# Patient Record
Sex: Female | Born: 1950 | Race: White | Hispanic: No | Marital: Married | Smoking: Current every day smoker
Health system: Southern US, Community
[De-identification: ages and names within clinical notes are randomized; demographics above are authoritative.]

## PROBLEM LIST (undated history)

## (undated) DIAGNOSIS — C801 Malignant (primary) neoplasm, unspecified: Secondary | ICD-10-CM

## (undated) DIAGNOSIS — Z974 Presence of external hearing-aid: Secondary | ICD-10-CM

## (undated) DIAGNOSIS — K219 Gastro-esophageal reflux disease without esophagitis: Secondary | ICD-10-CM

## (undated) HISTORY — PX: COLONOSCOPY: SHX174

## (undated) HISTORY — PX: ABDOMINAL HYSTERECTOMY: SHX81

---

## 2005-01-01 ENCOUNTER — Ambulatory Visit: Payer: Self-pay

## 2006-01-06 ENCOUNTER — Ambulatory Visit: Payer: Self-pay

## 2006-09-24 ENCOUNTER — Ambulatory Visit: Payer: Self-pay | Admitting: Unknown Physician Specialty

## 2007-04-01 ENCOUNTER — Ambulatory Visit: Payer: Self-pay | Admitting: Unknown Physician Specialty

## 2007-04-13 ENCOUNTER — Ambulatory Visit: Payer: Self-pay

## 2007-06-02 HISTORY — PX: BREAST CYST ASPIRATION: SHX578

## 2008-05-01 ENCOUNTER — Ambulatory Visit: Payer: Self-pay

## 2008-05-14 ENCOUNTER — Ambulatory Visit: Payer: Self-pay

## 2008-05-23 ENCOUNTER — Ambulatory Visit: Payer: Self-pay | Admitting: Family Medicine

## 2009-04-11 ENCOUNTER — Ambulatory Visit: Payer: Self-pay | Admitting: Unknown Physician Specialty

## 2009-05-21 ENCOUNTER — Ambulatory Visit: Payer: Self-pay

## 2010-03-01 ENCOUNTER — Ambulatory Visit: Payer: Self-pay | Admitting: Gynecologic Oncology

## 2010-03-03 ENCOUNTER — Ambulatory Visit: Payer: Self-pay | Admitting: Unknown Physician Specialty

## 2010-03-07 ENCOUNTER — Ambulatory Visit: Payer: Self-pay | Admitting: Unknown Physician Specialty

## 2010-03-12 LAB — PATHOLOGY REPORT

## 2010-03-18 ENCOUNTER — Ambulatory Visit: Payer: Self-pay | Admitting: Gynecologic Oncology

## 2010-04-01 ENCOUNTER — Ambulatory Visit: Payer: Self-pay | Admitting: Gynecologic Oncology

## 2010-04-08 ENCOUNTER — Ambulatory Visit: Payer: Self-pay | Admitting: Gynecologic Oncology

## 2010-05-01 ENCOUNTER — Ambulatory Visit: Payer: Self-pay | Admitting: Gynecologic Oncology

## 2010-05-22 ENCOUNTER — Ambulatory Visit: Payer: Self-pay | Admitting: Unknown Physician Specialty

## 2010-07-12 ENCOUNTER — Ambulatory Visit: Payer: Self-pay | Admitting: Internal Medicine

## 2011-07-01 ENCOUNTER — Ambulatory Visit: Payer: Self-pay

## 2011-07-31 ENCOUNTER — Ambulatory Visit: Payer: Self-pay | Admitting: Unknown Physician Specialty

## 2011-11-27 IMAGING — MG MAM DGTL DIAGNOSTIC MAMMO W/CAD
1 series · 6 of 6 positions shown · non-contrast
Comparison: none

REASON FOR EXAM: R BR NODULAR DEN FU   YEARLY
COMMENTS:

[R CC · right · 6 of 6 slices shown]
[im 1/6]
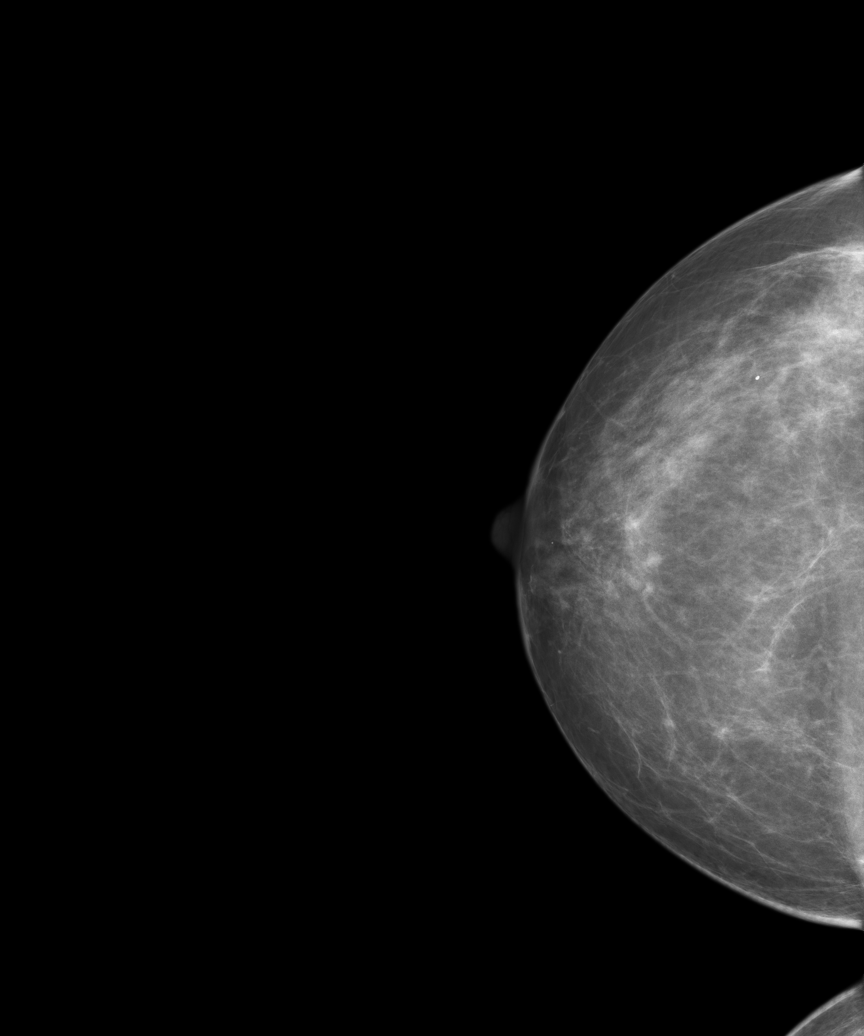
[im 2/6]
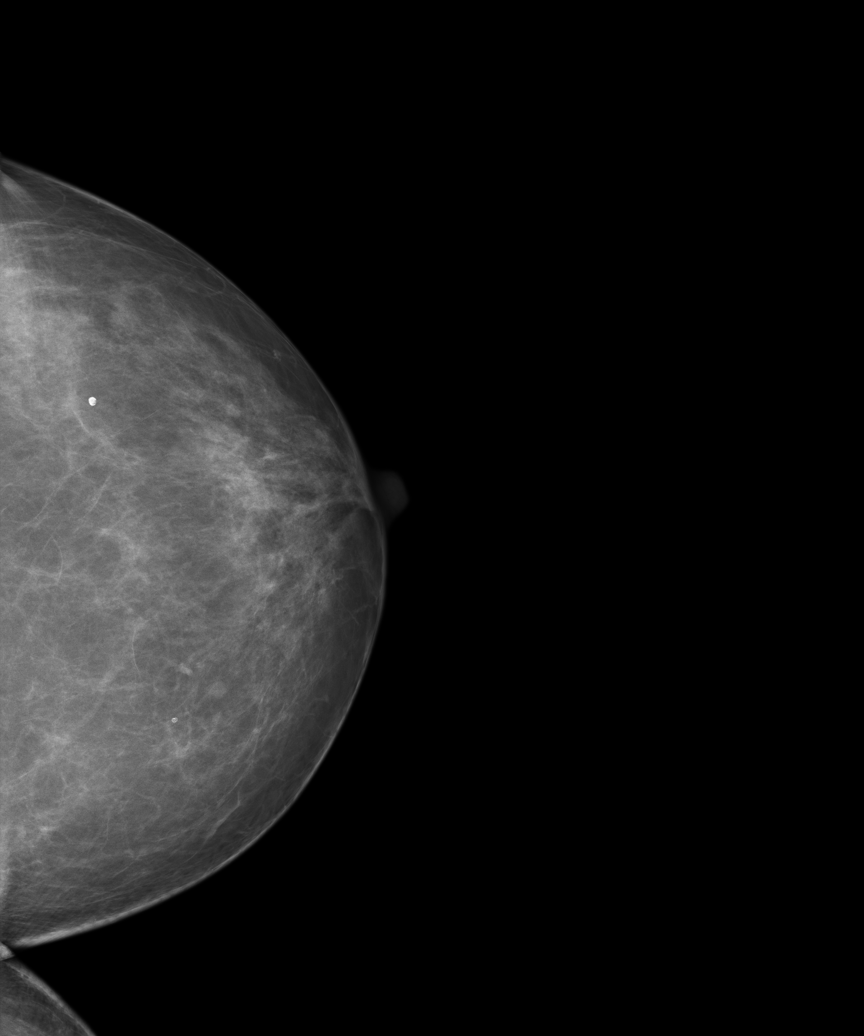
[im 3/6]
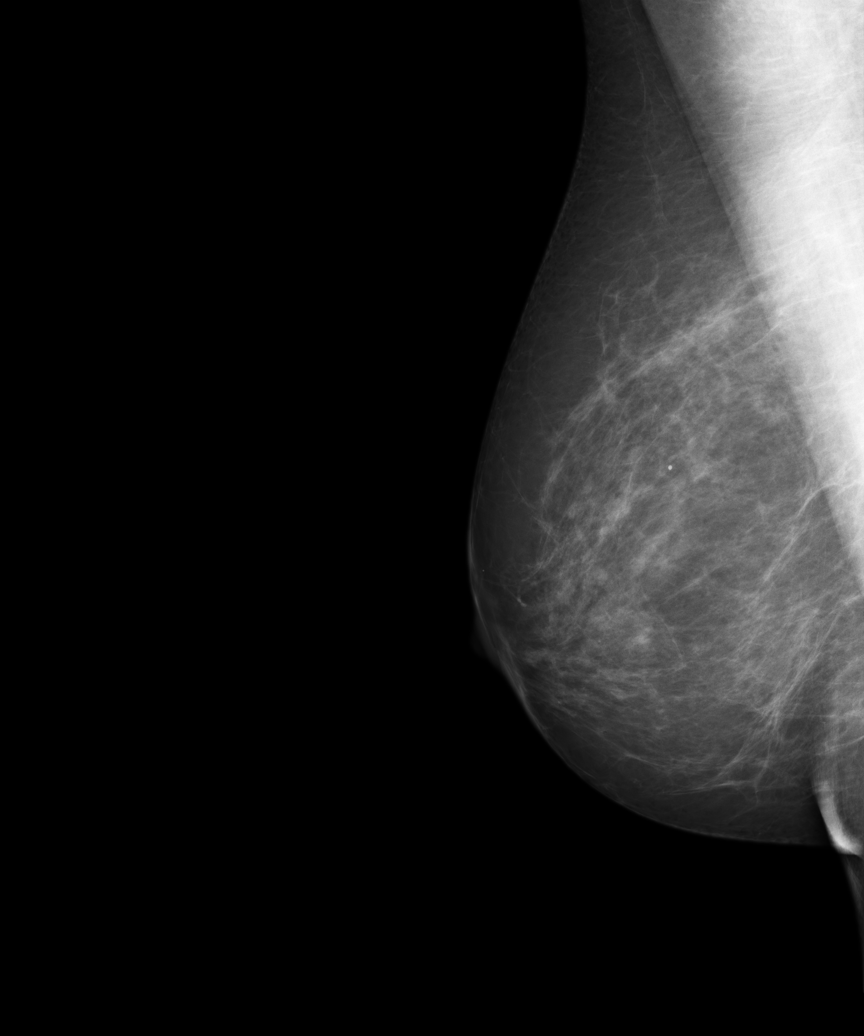
[im 4/6]
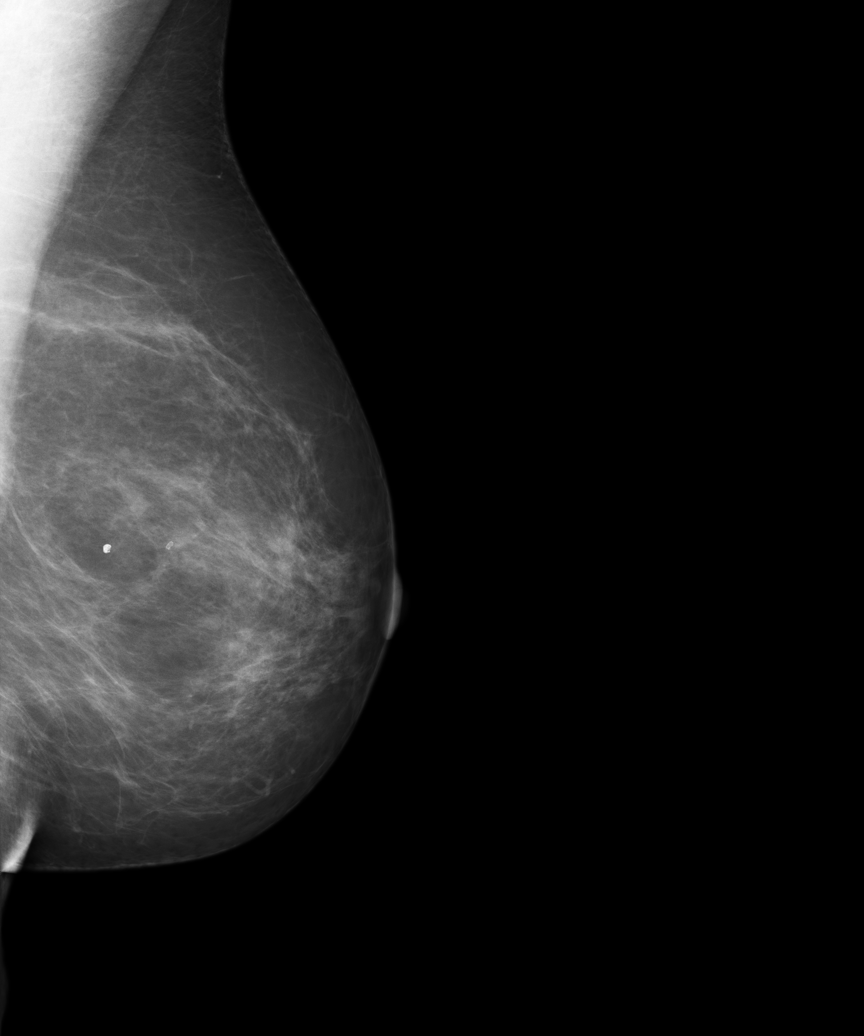
[im 5/6]
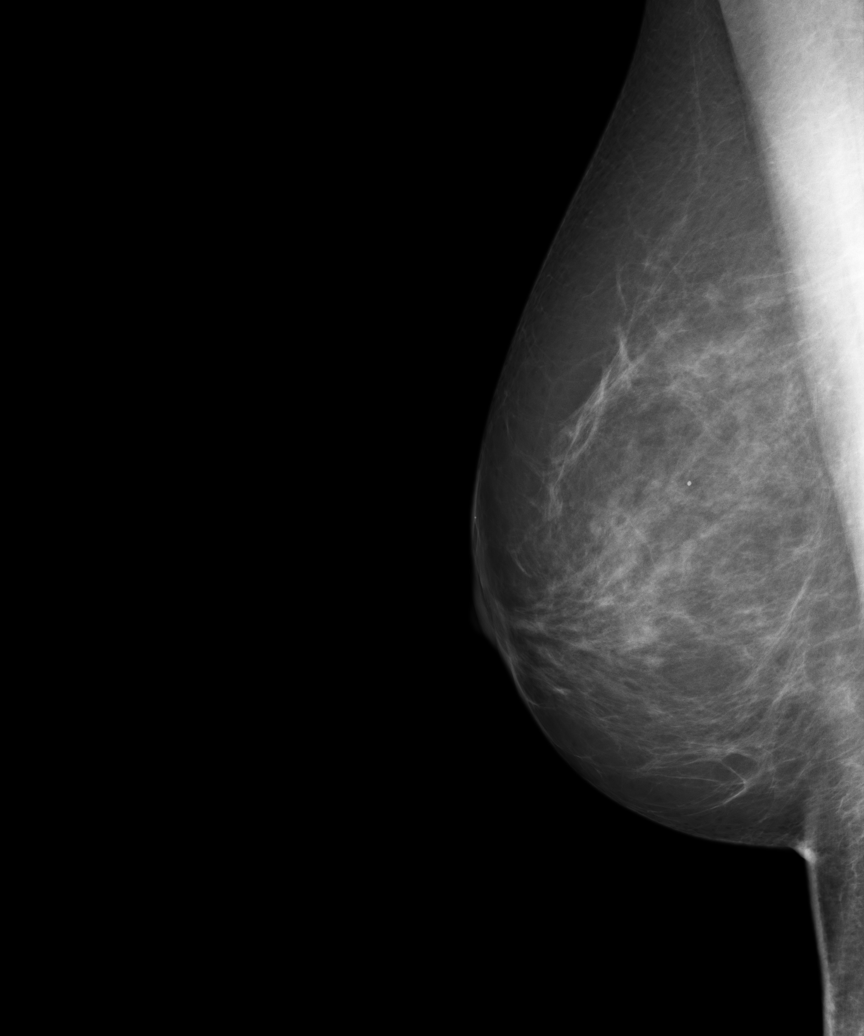
[im 6/6]
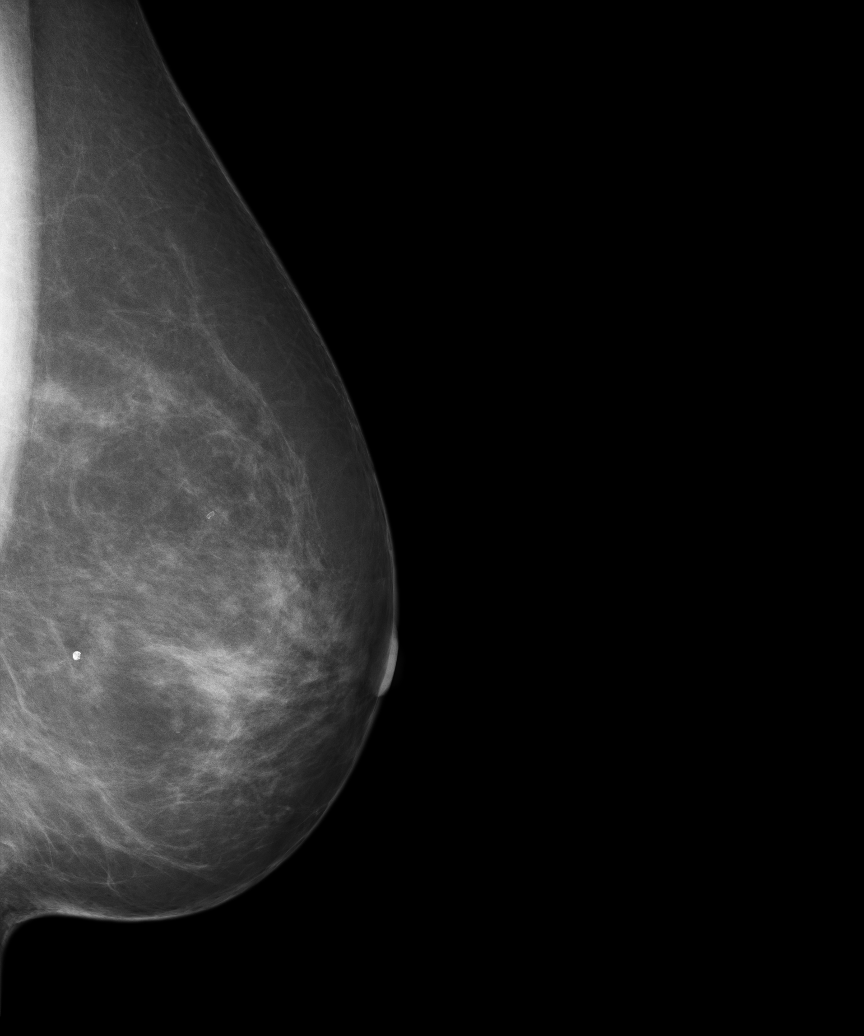

[6 of 6 positions shown; findings below may reference images not displayed]

PROCEDURE:     MAM - MAM DGTL DIAGNOSTIC MAMMO W/CAD  - May 21, 2009  [DATE]

RESULT:       Comparison is made to film screen images of 07/15/99 and to
digital images dated 05/01/08 as well as 05/18/01 from the right breast.

The breasts exhibit a mildly dense slightly heterogenous nodular
fibroglandular pattern.  A smoothly marginated density medially in the right
breast is no longer evident. There is no developing density or dominant
mass. There is no malignant calcification.
IMPRESSION: 1.      Benign-appearing bilateral mammogram.
2.     BI-RADS:  Category 2- Benign Finding.
3.     Please continue to encourage appropriate mammographic follow up.

A negative mammogram report does not preclude biopsy or other evaluation of
a clinically palpable or otherwise suspicious mass or lesion. Breast cancer
may not be detected by mammography in up to 10% of cases.

## 2012-01-27 ENCOUNTER — Ambulatory Visit: Payer: Self-pay | Admitting: Internal Medicine

## 2012-01-27 LAB — URINALYSIS, COMPLETE
Bilirubin,UR: NEGATIVE
Glucose,UR: NEGATIVE mg/dL (ref 0–75)
Nitrite: NEGATIVE
Ph: 6 (ref 4.5–8.0)
Protein: NEGATIVE

## 2012-07-12 ENCOUNTER — Ambulatory Visit: Payer: Self-pay

## 2013-03-16 ENCOUNTER — Emergency Department: Payer: Self-pay | Admitting: Emergency Medicine

## 2013-03-16 ENCOUNTER — Ambulatory Visit: Payer: Self-pay

## 2013-03-16 LAB — BASIC METABOLIC PANEL
Anion Gap: 6 — ABNORMAL LOW (ref 7–16)
BUN: 18 mg/dL (ref 7–18)
Calcium, Total: 9.2 mg/dL (ref 8.5–10.1)
Chloride: 106 mmol/L (ref 98–107)
Co2: 28 mmol/L (ref 21–32)
EGFR (African American): >60
Sodium: 140 mmol/L (ref 136–145)

## 2013-03-16 LAB — CBC
HCT: 41.5 % (ref 35.0–47.0)
MCH: 30.9 pg (ref 26.0–34.0)
MCHC: 34.5 g/dL (ref 32.0–36.0)
MCV: 90 fL (ref 80–100)
Platelet: 303 10*3/uL (ref 150–440)
RBC: 4.63 10*6/uL (ref 3.80–5.20)
RDW: 13.2 % (ref 11.5–14.5)
WBC: 8.8 10*3/uL (ref 3.6–11.0)

## 2013-03-24 ENCOUNTER — Ambulatory Visit: Payer: Self-pay | Admitting: General Practice

## 2013-05-16 ENCOUNTER — Encounter: Payer: Self-pay | Admitting: General Practice

## 2013-06-01 ENCOUNTER — Encounter: Payer: Self-pay | Admitting: General Practice

## 2013-07-02 ENCOUNTER — Encounter: Payer: Self-pay | Admitting: General Practice

## 2013-07-20 ENCOUNTER — Ambulatory Visit: Payer: Self-pay | Admitting: Obstetrics and Gynecology

## 2013-07-30 ENCOUNTER — Encounter: Payer: Self-pay | Admitting: General Practice

## 2014-05-08 ENCOUNTER — Ambulatory Visit: Payer: Self-pay | Admitting: Family Medicine

## 2014-05-08 LAB — URINALYSIS, COMPLETE
Bilirubin,UR: NEGATIVE
GLUCOSE, UR: NEGATIVE
Ketone: NEGATIVE
NITRITE: NEGATIVE
Ph: 6 (ref 5.0–8.0)
Protein: NEGATIVE
Specific Gravity: 1.01 (ref 1.000–1.030)

## 2014-05-10 LAB — URINE CULTURE

## 2014-07-23 ENCOUNTER — Ambulatory Visit: Payer: Self-pay | Admitting: Family Medicine

## 2014-07-25 ENCOUNTER — Ambulatory Visit: Payer: Self-pay | Admitting: Family Medicine

## 2014-09-21 NOTE — Consult Note (Signed)
PATIENT NAME:  Debra Frank, Debra Frank MR#:  [id number] DATE OF BIRTH:  September 06, 1950  DATE OF CONSULTATION:  03/16/2013  CONSULTING PHYSICIAN:  Jeneen Rinks P. Holley Bouche., MD  REFERRING PHYSICIAN: Dr. Thomasene Lot (Emergency Department)   CHIEF COMPLAINT: Right wrist pain.   HISTORY OF PRESENT ILLNESS: The patient is a 64 year old right-hand dominant female who was playing with her grandson when she apparently fell from a skateboard and landed on her outstretched right hand. She had the immediate onset of severe right wrist pain and had noticeable deformity to the wrist. She apparently initially presented to [facility] and was subsequently referred to [facility] Emergency Department at which time radiographs were obtained. The patient denied any other injury or pain other than the right wrist.   PAST MEDICAL HISTORY: Uterine cancer. Status post hysterectomy.   MEDICATIONS: None.   ALLERGIES: No known drug allergies.   SOCIAL HISTORY: The patient is married. She works as a Photographer. She smokes one cigarette a day.   FAMILY HISTORY: Noncontributory.   PHYSICAL EXAMINATION: GENERAL: The patient is a pleasant, well-developed, well-nourished female seen in obvious discomfort.  RIGHT UPPER EXTREMITY:  No tenderness to palpation about the elbow. There is gross deformity to the distal radius with crepitus appreciated. Moderate swelling is noted without any gross ecchymosis. No erythema. Good capillary refill was noted. Good radial pulse is noted. Sensory motor function is grossly intact.   X-RAYS:  Attempted AP, lateral and oblique radiographs of the right wrist were reviewed. There is a comminuted right distal radius fracture with significant displacement.   IMPRESSION: Comminuted right distal radius fracture.   PLAN: Findings were discussed in detail with the patient and her husband. Recommended provisional closed reduction tonight with anticipated need for open  reduction and internal fixation of the right distal radius once the swelling subsides. The patient and her husband were in agreement. The patient's right hand was suspended finger traps and 5 pounds of counter traction was applied to the upper arm. This allowed for gentle reduction of the fracture site with improved alignment noted. A well-padded sugar tong splint was applied and traction was removed. Again, good capillary refill and sensory function is noted following the reduction.   The patient instructed on ice and elevation to the right wrist. Prescription was given for Norco 5/325, 1 to 2 tablets oral every 4 hours as needed for pain #50.   The patient is to call the office tomorrow for an appointment next week. Depending upon the swelling, I would anticipate of surgical intervention late next week.    ____________________________ Laurice Record. Holley Bouche., MD jph:cc D: 03/16/2013 22:33:36 ET T: 03/16/2013 22:47:22 ET JOB#: [id number]  cc: Laurice Record. Holley Bouche., MD, <Dictator> JAMES P Holley Bouche MD ELECTRONICALLY SIGNED 04/02/2013 11:07

## 2014-09-21 NOTE — Op Note (Signed)
PATIENT NAME:  Debra Frank, DURA MR#:  [id number] DATE OF BIRTH:  1950-08-27  DATE OF PROCEDURE:  03/24/2013  PREOPERATIVE DIAGNOSIS: Comminuted right distal radius fracture.   POSTOPERATIVE DIAGNOSIS:  Comminuted right distal radius fracture.   PROCEDURE PERFORMED: Open reduction and internal fixation of right distal radius fracture.   SURGEON: Skip Estimable, MD   ANESTHESIA: General.   ESTIMATED BLOOD LOSS: Minimal.   TOURNIQUET TIME: 74 minutes.   FLUIDS REPLACED: 800 mL of crystalloid.   IMPLANTS UTILIZED: [employer] DVRA right short plate, five 2.5 mm fully threaded pegs, two 2.5 mm smooth pegs, and three 3.5 mm cortical screws.   INDICATIONS FOR SURGERY: The patient is a 64 year old right-hand dominant female who fell on an outstretched right arm while playing with her a grandson on a skateboard. X-rays demonstrated a comminuted displaced right distal radius fracture. Provisional reduction has been performed in the Emergency Department, but definitive surgical intervention was deferred due to the degree of swelling. Recommendation was made for open reduction, internal fixation of the distal radius fracture. Risks and benefits of surgical intervention were discussed with the patient. The patient expressed understanding of the risks and benefits and agreed with plans for surgical intervention.   PROCEDURE IN DETAIL: The patient was brought into the Operating Room and, after adequate general anesthesia was achieved, a tourniquet was placed on the patient's upper right arm. The patient's right hand and arm were cleaned and prepped with alcohol and DuraPrep and draped in the usual sterile fashion. A "timeout" was performed as per usual protocol. Loupe magnification was used throughout the procedure.    The right upper extremity was exsanguinated using an Esmarch, and the tourniquet was inflated to 250 mmHg. A longitudinal incision was made in line with the flexor carpi radialis tendon. The  tendon sheath was incised, and the tendon was reflected in an ulnar direction. The floor of the tendon sheath was incised, and dissection was carried down to the pronator quadratus. An L-type incision was made, and the pronator was elevated off of the volar surface of the distal radius. The fracture site was identified and soft tissue was debrided from the fracture edges.   Using a combination of distraction, extension and subsequent flexion, the distal radius was reduced, and position was confirmed in AP and lateral planes using the FluoroScan. Provisional pin fixation of the DVRA short plate was performed, and position confirmed using the FluoroScan. A 3.5 mm cortical screw was inserted into the slotted hole and tightened into place. The distal fragment was then secured using two 2.0 mm K wires. Again, excellent reduction was noted in multiple planes using the FluoroScan.   A total of five 2.5 mm fully threaded pegs and two 2.5 mm smooth pegs were inserted into the proximal and distal rows of the volar plate. Good position was noted in multiple planes using the FluoroScan. Next, the proximal portion of the plate was further secured using two additional 3.5 mm screws. Good pronation and supination were noted. Good wrist flexion and extension were appreciated. Excellent reduction was appreciated. The wound was irrigated with copious amounts of normal saline with antibiotic solution.   The tourniquet was deflated after a total tourniquet time of 74 minutes. Hemostasis was achieved using electrocautery. The pronator was repaired using 2-0 Vicryl so as to cover the volar surface of the plate. The wound was then closed in layers using first 2-0 Vicryl with running subcuticular suture of 4-0 Vicryl used for the skin closure. Steri-Strips were  applied. 0.25% Marcaine was injected along the incision site. A sterile dressing was applied, followed by application of a volar splint.   The patient tolerated the  procedure well. She was transported to the recovery room in stable condition.   ____________________________ Laurice Record. Holley Bouche., MD jph:cg D: 03/24/2013 23:23:20 ET T: 03/25/2013 00:05:39 ET JOB#: [id number]  cc: Laurice Record. Holley Bouche., MD, <Dictator> Laurice Record Holley Bouche MD ELECTRONICALLY SIGNED 03/25/2013 11:42

## 2014-10-02 ENCOUNTER — Ambulatory Visit
Admission: EM | Admit: 2014-10-02 | Discharge: 2014-10-02 | Disposition: A | Discharge status: Home / Self Care | Payer: BC Managed Care – PPO | Attending: Family Medicine | Admitting: Family Medicine

## 2014-10-02 DIAGNOSIS — Z79899 Other long term (current) drug therapy: Secondary | ICD-10-CM | POA: Diagnosis not present

## 2014-10-02 DIAGNOSIS — J029 Acute pharyngitis, unspecified: Secondary | ICD-10-CM | POA: Diagnosis not present

## 2014-10-02 DIAGNOSIS — N390 Urinary tract infection, site not specified: Secondary | ICD-10-CM | POA: Diagnosis not present

## 2014-10-02 DIAGNOSIS — Z87891 Personal history of nicotine dependence: Secondary | ICD-10-CM | POA: Insufficient documentation

## 2014-10-02 HISTORY — DX: Malignant (primary) neoplasm, unspecified: C80.1

## 2014-10-02 LAB — RAPID STREP SCREEN (MED CTR MEBANE ONLY): STREPTOCOCCUS, GROUP A SCREEN (DIRECT): NEGATIVE

## 2014-10-02 LAB — URINALYSIS COMPLETE WITH MICROSCOPIC (ARMC ONLY)
Bilirubin Urine: NEGATIVE
Glucose, UA: NEGATIVE mg/dL
KETONES UR: NEGATIVE mg/dL
Nitrite: NEGATIVE
PH: 6.5 (ref 5.0–8.0)
Protein, ur: 30 mg/dL — AB
Specific Gravity, Urine: 1.015 (ref 1.005–1.030)

## 2014-10-02 MED ORDER — CIPROFLOXACIN HCL 500 MG PO TABS: 500.0000 mg | ORAL_TABLET | Freq: Two times a day (BID) | ORAL | Status: DC

## 2014-10-02 NOTE — Discharge Instructions (Signed)

## 2014-10-02 NOTE — ED Provider Notes (Signed)
CSN: [id number]     Arrival date & time 10/02/14  0759 History   First MD Initiated Contact with Patient 10/02/14 [phone]     Chief Complaint  Patient presents with  . Sore Throat  . Urinary Tract Infection   (Consider location/radiation/quality/duration/timing/severity/associated sxs/prior Treatment) Patient is a 64 y.o. female presenting with pharyngitis and urinary tract infection. The history is provided by the patient.  Sore Throat This is a new problem. The current episode started more than 1 week ago. Episode frequency: iintermittently. The problem has been gradually improving. The symptoms are aggravated by swallowing. She has tried acetaminophen for the symptoms.  Urinary Tract Infection This is a new problem. The current episode started 2 days ago. The problem occurs constantly. Associated symptoms comments: Dysuria, urinary frequency; cloudy urine. Treatments tried: otc pyridium.    Past Medical History  Diagnosis Date  . Cancer    Past Surgical History  Procedure Laterality Date  . Abdominal hysterectomy     Family History  Problem Relation Age of Onset  . Dementia Mother   . Cancer Father    History  Substance Use Topics  . Smoking status: Former Smoker    Quit date: 10/01/2009  . Smokeless tobacco: Not on file  . Alcohol Use: No   OB History    No data available     Review of Systems  Allergies  Review of patient's allergies indicates no known allergies.  Home Medications   Prior to Admission medications   Medication Sig Start Date End Date Taking? Authorizing Provider  ciprofloxacin (CIPRO) 500 MG tablet Take 1 tablet (500 mg total) by mouth every 12 (twelve) hours. 10/02/14   Norval Gable, MD   BP 132/77 mmHg  Pulse 92  Temp(Src) 97.7 F (36.5 C) (Tympanic)  Resp 17  Ht 5\' 6"  (1.676 m)  Wt 135 lb (61.236 kg)  BMI 21.80 kg/m2  SpO2 98% Physical Exam  Constitutional: She appears well-developed and well-nourished. No distress.  HENT:  Right Ear:  External ear normal.  Left Ear: External ear normal.  Nose: Nose normal.  Mouth/Throat: No oropharyngeal exudate.  Pharynx erythematous without exudate  Eyes: Conjunctivae are normal. Right eye exhibits no discharge. Left eye exhibits no discharge.  Neck: Normal range of motion. Neck supple. No thyromegaly present.  Cardiovascular: Normal heart sounds.   Pulmonary/Chest: Effort normal and breath sounds normal.  Abdominal: Soft. Bowel sounds are normal. She exhibits no distension and no mass. There is no tenderness. There is no rebound and no guarding.  Lymphadenopathy:    She has no cervical adenopathy.  Skin: Skin is warm and dry. No rash noted. She is not diaphoretic. No erythema.  Nursing note and vitals reviewed.   ED Course  Procedures (including critical care time) Labs Review Labs Reviewed  URINALYSIS COMPLETEWITH MICROSCOPIC [facility])  - Abnormal; Notable for the following:    APPearance HAZY (*)    Hgb urine dipstick 2+ (*)    Protein, ur 30 (*)    Leukocytes, UA 3+ (*)    Bacteria, UA FEW (*)    Squamous Epithelial / LPF 0-5 (*)    All other components within normal limits  RAPID STREP SCREEN  URINE CULTURE  CULTURE, GROUP A STREP [facility])    Imaging Review No results found.   MDM   1. UTI (lower urinary tract infection)   2. Pharyngitis     Plan: 1. Test results and diagnosis reviewed with patient 2. rx as per orders; risks, benefits,  potential side effects reviewed with patient 3. Recommend supportive treatment with increased fluids 4. F/u prn if symptoms worsen or don't improve    Norval Gable, MD 10/02/14 1041

## 2014-10-02 NOTE — ED Notes (Signed)
Started 2 weeks ago with sore throat, slightly sore to swallow and last night started with laryngitis. + productive cough. Also started 2 days ago with urinary frequency, burning and odor. Voiding small amounts

## 2014-10-04 LAB — CULTURE, GROUP A STREP (THRC)

## 2014-10-04 LAB — URINE CULTURE: Culture: >=100000

## 2015-07-15 ENCOUNTER — Other Ambulatory Visit: Payer: Self-pay | Admitting: Family Medicine

## 2015-07-15 DIAGNOSIS — Z1231 Encounter for screening mammogram for malignant neoplasm of breast: Secondary | ICD-10-CM

## 2015-07-30 ENCOUNTER — Ambulatory Visit
Admission: RE | Admit: 2015-07-30 | Discharge: 2015-07-30 | Disposition: A | Discharge status: Home / Self Care | Payer: BC Managed Care – PPO | Source: Ambulatory Visit | Attending: Family Medicine | Admitting: Family Medicine

## 2015-07-30 DIAGNOSIS — Z1231 Encounter for screening mammogram for malignant neoplasm of breast: Secondary | ICD-10-CM | POA: Insufficient documentation

## 2016-06-26 ENCOUNTER — Other Ambulatory Visit: Payer: Self-pay | Admitting: Obstetrics and Gynecology

## 2016-06-26 ENCOUNTER — Other Ambulatory Visit: Payer: Self-pay | Admitting: Family Medicine

## 2016-06-26 DIAGNOSIS — Z1231 Encounter for screening mammogram for malignant neoplasm of breast: Secondary | ICD-10-CM

## 2016-07-30 ENCOUNTER — Ambulatory Visit: Payer: BC Managed Care – PPO

## 2016-08-10 ENCOUNTER — Ambulatory Visit: Payer: BC Managed Care – PPO

## 2016-08-10 ENCOUNTER — Ambulatory Visit
Admission: RE | Admit: 2016-08-10 | Discharge: 2016-08-10 | Disposition: A | Discharge status: Home / Self Care | Payer: Medicare HMO | Source: Ambulatory Visit | Attending: Obstetrics and Gynecology | Admitting: Obstetrics and Gynecology

## 2016-08-10 DIAGNOSIS — Z1231 Encounter for screening mammogram for malignant neoplasm of breast: Secondary | ICD-10-CM | POA: Insufficient documentation

## 2017-07-12 ENCOUNTER — Other Ambulatory Visit: Payer: Self-pay | Admitting: Family Medicine

## 2017-07-12 ENCOUNTER — Other Ambulatory Visit: Payer: Self-pay | Admitting: Obstetrics and Gynecology

## 2017-07-12 DIAGNOSIS — Z1231 Encounter for screening mammogram for malignant neoplasm of breast: Secondary | ICD-10-CM

## 2017-09-02 ENCOUNTER — Ambulatory Visit
Admission: RE | Admit: 2017-09-02 | Discharge: 2017-09-02 | Disposition: A | Discharge status: Home / Self Care | Payer: Medicare HMO | Source: Ambulatory Visit | Attending: Family Medicine | Admitting: Family Medicine

## 2017-09-02 DIAGNOSIS — R928 Other abnormal and inconclusive findings on diagnostic imaging of breast: Secondary | ICD-10-CM | POA: Diagnosis not present

## 2017-09-02 DIAGNOSIS — Z1231 Encounter for screening mammogram for malignant neoplasm of breast: Secondary | ICD-10-CM | POA: Insufficient documentation

## 2017-09-06 ENCOUNTER — Other Ambulatory Visit: Payer: Self-pay | Admitting: Family Medicine

## 2017-09-06 DIAGNOSIS — Z780 Asymptomatic menopausal state: Secondary | ICD-10-CM

## 2017-09-10 ENCOUNTER — Other Ambulatory Visit: Payer: Self-pay | Admitting: Family Medicine

## 2017-09-10 DIAGNOSIS — N6310 Unspecified lump in the right breast, unspecified quadrant: Secondary | ICD-10-CM

## 2017-09-10 DIAGNOSIS — R928 Other abnormal and inconclusive findings on diagnostic imaging of breast: Secondary | ICD-10-CM

## 2017-09-20 ENCOUNTER — Ambulatory Visit
Admission: RE | Admit: 2017-09-20 | Discharge: 2017-09-20 | Disposition: A | Discharge status: Home / Self Care | Payer: Medicare HMO | Source: Ambulatory Visit | Attending: Family Medicine | Admitting: Family Medicine

## 2017-09-20 DIAGNOSIS — R928 Other abnormal and inconclusive findings on diagnostic imaging of breast: Secondary | ICD-10-CM

## 2017-09-20 DIAGNOSIS — N6001 Solitary cyst of right breast: Secondary | ICD-10-CM | POA: Diagnosis not present

## 2017-09-20 DIAGNOSIS — Z803 Family history of malignant neoplasm of breast: Secondary | ICD-10-CM | POA: Diagnosis not present

## 2017-09-20 DIAGNOSIS — N6310 Unspecified lump in the right breast, unspecified quadrant: Secondary | ICD-10-CM | POA: Diagnosis present

## 2017-10-28 ENCOUNTER — Ambulatory Visit
Admission: RE | Admit: 2017-10-28 | Discharge: 2017-10-28 | Disposition: A | Discharge status: Home / Self Care | Payer: Medicare HMO | Source: Ambulatory Visit | Attending: Family Medicine | Admitting: Family Medicine

## 2017-10-28 DIAGNOSIS — Z780 Asymptomatic menopausal state: Secondary | ICD-10-CM | POA: Insufficient documentation

## 2018-02-09 ENCOUNTER — Other Ambulatory Visit: Payer: Self-pay | Admitting: Family Medicine

## 2018-02-09 DIAGNOSIS — R928 Other abnormal and inconclusive findings on diagnostic imaging of breast: Secondary | ICD-10-CM

## 2018-02-10 ENCOUNTER — Other Ambulatory Visit: Payer: Self-pay | Admitting: Family Medicine

## 2018-02-10 DIAGNOSIS — R928 Other abnormal and inconclusive findings on diagnostic imaging of breast: Secondary | ICD-10-CM

## 2018-03-23 ENCOUNTER — Ambulatory Visit
Admission: RE | Admit: 2018-03-23 | Discharge: 2018-03-23 | Disposition: A | Discharge status: Home / Self Care | Payer: Medicare HMO | Source: Ambulatory Visit | Attending: Family Medicine | Admitting: Family Medicine

## 2018-03-23 DIAGNOSIS — R928 Other abnormal and inconclusive findings on diagnostic imaging of breast: Secondary | ICD-10-CM

## 2018-08-24 ENCOUNTER — Other Ambulatory Visit: Payer: Self-pay | Admitting: Medical Oncology

## 2018-08-24 DIAGNOSIS — R928 Other abnormal and inconclusive findings on diagnostic imaging of breast: Secondary | ICD-10-CM

## 2018-10-07 ENCOUNTER — Other Ambulatory Visit: Payer: BC Managed Care – PPO

## 2018-10-17 ENCOUNTER — Ambulatory Visit
Admission: RE | Admit: 2018-10-17 | Discharge: 2018-10-17 | Disposition: A | Discharge status: Home / Self Care | Payer: Medicare HMO | Source: Ambulatory Visit | Attending: Medical Oncology | Admitting: Medical Oncology

## 2018-10-17 ENCOUNTER — Other Ambulatory Visit: Payer: Self-pay

## 2018-10-17 DIAGNOSIS — R928 Other abnormal and inconclusive findings on diagnostic imaging of breast: Secondary | ICD-10-CM

## 2019-08-21 ENCOUNTER — Other Ambulatory Visit: Payer: Self-pay | Admitting: Family Medicine

## 2019-08-21 DIAGNOSIS — Z1231 Encounter for screening mammogram for malignant neoplasm of breast: Secondary | ICD-10-CM

## 2019-08-28 ENCOUNTER — Other Ambulatory Visit: Payer: Self-pay | Admitting: Family Medicine

## 2019-08-28 DIAGNOSIS — Z780 Asymptomatic menopausal state: Secondary | ICD-10-CM

## 2019-11-01 ENCOUNTER — Ambulatory Visit
Admission: RE | Admit: 2019-11-01 | Discharge: 2019-11-01 | Disposition: A | Discharge status: Home / Self Care | Payer: Medicare HMO | Source: Ambulatory Visit | Attending: Family Medicine | Admitting: Family Medicine

## 2019-11-01 DIAGNOSIS — Z780 Asymptomatic menopausal state: Secondary | ICD-10-CM | POA: Insufficient documentation

## 2019-11-01 DIAGNOSIS — Z1231 Encounter for screening mammogram for malignant neoplasm of breast: Secondary | ICD-10-CM | POA: Diagnosis present

## 2020-08-12 ENCOUNTER — Other Ambulatory Visit: Payer: Self-pay | Admitting: Family Medicine

## 2020-08-26 ENCOUNTER — Other Ambulatory Visit: Payer: Self-pay | Admitting: Family Medicine

## 2020-08-26 ENCOUNTER — Other Ambulatory Visit (HOSPITAL_COMMUNITY): Payer: Self-pay | Admitting: Family Medicine

## 2020-08-26 DIAGNOSIS — Z1231 Encounter for screening mammogram for malignant neoplasm of breast: Secondary | ICD-10-CM

## 2020-08-26 DIAGNOSIS — E041 Nontoxic single thyroid nodule: Secondary | ICD-10-CM

## 2020-09-24 ENCOUNTER — Ambulatory Visit
Admission: RE | Admit: 2020-09-24 | Discharge: 2020-09-24 | Disposition: A | Discharge status: Home / Self Care | Payer: Medicare HMO | Source: Ambulatory Visit | Attending: Family Medicine | Admitting: Family Medicine

## 2020-09-24 ENCOUNTER — Other Ambulatory Visit: Payer: Self-pay

## 2020-09-24 DIAGNOSIS — E041 Nontoxic single thyroid nodule: Secondary | ICD-10-CM | POA: Diagnosis not present

## 2020-10-24 NOTE — Addendum Note (Signed)
Encounter addended by: Annie Paras on: 10/24/2020 7:12 PM  Actions taken: Letter saved

## 2020-11-04 ENCOUNTER — Other Ambulatory Visit: Payer: Self-pay | Admitting: Otolaryngology

## 2020-11-04 DIAGNOSIS — E041 Nontoxic single thyroid nodule: Secondary | ICD-10-CM

## 2020-11-05 ENCOUNTER — Other Ambulatory Visit: Payer: Self-pay

## 2020-11-05 ENCOUNTER — Ambulatory Visit
Admission: RE | Admit: 2020-11-05 | Discharge: 2020-11-05 | Disposition: A | Discharge status: Home / Self Care | Payer: Medicare HMO | Source: Ambulatory Visit | Attending: Family Medicine | Admitting: Family Medicine

## 2020-11-05 DIAGNOSIS — E041 Nontoxic single thyroid nodule: Secondary | ICD-10-CM | POA: Diagnosis present

## 2020-11-05 DIAGNOSIS — D34 Benign neoplasm of thyroid gland: Secondary | ICD-10-CM | POA: Insufficient documentation

## 2020-11-05 DIAGNOSIS — Z1231 Encounter for screening mammogram for malignant neoplasm of breast: Secondary | ICD-10-CM | POA: Insufficient documentation

## 2020-11-05 DIAGNOSIS — E049 Nontoxic goiter, unspecified: Secondary | ICD-10-CM | POA: Insufficient documentation

## 2020-11-06 ENCOUNTER — Ambulatory Visit
Admission: RE | Admit: 2020-11-06 | Discharge: 2020-11-06 | Disposition: A | Discharge status: Home / Self Care | Payer: Medicare HMO | Source: Ambulatory Visit | Attending: Otolaryngology | Admitting: Otolaryngology

## 2020-11-06 DIAGNOSIS — Z1231 Encounter for screening mammogram for malignant neoplasm of breast: Secondary | ICD-10-CM | POA: Diagnosis not present

## 2020-11-06 DIAGNOSIS — E041 Nontoxic single thyroid nodule: Secondary | ICD-10-CM

## 2020-11-06 NOTE — Procedures (Signed)
Interventional Radiology Procedure Note  Procedure: US Guided Biopsy of left thyroid nodule  Complications: None  Estimated Blood Loss: < 5 mL  Findings: 25 G FNA x 5 of 3.4 cm left thyroid nodule performed under US guidance.   Venetia Night. Kathlene Cote, M.D Pager:  [phone]

## 2020-11-07 LAB — CYTOLOGY - NON PAP

## 2021-08-20 ENCOUNTER — Other Ambulatory Visit: Payer: Self-pay | Admitting: Family Medicine

## 2021-08-20 DIAGNOSIS — Z1231 Encounter for screening mammogram for malignant neoplasm of breast: Secondary | ICD-10-CM

## 2021-08-20 DIAGNOSIS — Z780 Asymptomatic menopausal state: Secondary | ICD-10-CM

## 2021-11-10 ENCOUNTER — Ambulatory Visit
Admission: RE | Admit: 2021-11-10 | Discharge: 2021-11-10 | Disposition: A | Discharge status: Home / Self Care | Payer: Medicare HMO | Source: Ambulatory Visit | Attending: Family Medicine | Admitting: Family Medicine

## 2021-11-10 ENCOUNTER — Ambulatory Visit: Payer: Medicare HMO

## 2021-11-10 DIAGNOSIS — Z780 Asymptomatic menopausal state: Secondary | ICD-10-CM | POA: Diagnosis present

## 2021-11-10 DIAGNOSIS — Z1231 Encounter for screening mammogram for malignant neoplasm of breast: Secondary | ICD-10-CM | POA: Insufficient documentation

## 2021-11-14 ENCOUNTER — Other Ambulatory Visit: Payer: Self-pay | Admitting: Family Medicine

## 2021-11-14 DIAGNOSIS — R928 Other abnormal and inconclusive findings on diagnostic imaging of breast: Secondary | ICD-10-CM

## 2021-11-14 DIAGNOSIS — N63 Unspecified lump in unspecified breast: Secondary | ICD-10-CM

## 2021-12-01 ENCOUNTER — Ambulatory Visit
Admission: RE | Admit: 2021-12-01 | Discharge: 2021-12-01 | Disposition: A | Discharge status: Home / Self Care | Payer: Medicare HMO | Source: Ambulatory Visit | Attending: Family Medicine | Admitting: Family Medicine

## 2021-12-01 DIAGNOSIS — N63 Unspecified lump in unspecified breast: Secondary | ICD-10-CM | POA: Diagnosis present

## 2021-12-01 DIAGNOSIS — R928 Other abnormal and inconclusive findings on diagnostic imaging of breast: Secondary | ICD-10-CM

## 2022-07-27 ENCOUNTER — Ambulatory Visit: Payer: Medicare HMO | Admitting: Dermatology

## 2022-09-09 ENCOUNTER — Other Ambulatory Visit: Payer: Self-pay | Admitting: Family Medicine

## 2022-09-09 DIAGNOSIS — Z780 Asymptomatic menopausal state: Secondary | ICD-10-CM

## 2022-10-29 ENCOUNTER — Encounter: Payer: Self-pay | Admitting: Ophthalmology

## 2022-10-29 NOTE — Anesthesia Preprocedure Evaluation (Addendum)
Anesthesia Evaluation  Patient identified by MRN, date of birth, ID band Patient awake    Reviewed: Allergy & Precautions, H&P , NPO status , Patient's Chart, lab work & pertinent test results  Airway Mallampati: III  TM Distance: <3 FB Neck ROM: Full    Dental no notable dental hx.    Pulmonary neg pulmonary ROS, Current Smoker and Patient abstained from smoking.   Pulmonary exam normal breath sounds clear to auscultation       Cardiovascular negative cardio ROS Normal cardiovascular exam Rhythm:Regular Rate:Normal     Neuro/Psych negative neurological ROS  negative psych ROS   GI/Hepatic negative GI ROS, Neg liver ROS,GERD  ,,  Endo/Other  negative endocrine ROS    Renal/GU negative Renal ROS  negative genitourinary   Musculoskeletal negative musculoskeletal ROS (+)    Abdominal   Peds negative pediatric ROS (+)  Hematology negative hematology ROS (+)   Anesthesia Other Findings Cancer  GERD (gastroesophageal reflux disease) Wears hearing aid in both ears     Reproductive/Obstetrics negative OB ROS                              Anesthesia Physical Anesthesia Plan  ASA: 3  Anesthesia Plan: MAC   Post-op Pain Management:    Induction: Intravenous  PONV Risk Score and Plan:   Airway Management Planned: Natural Airway and Nasal Cannula  Additional Equipment:   Intra-op Plan:   Post-operative Plan:   Informed Consent: I have reviewed the patients History and Physical, chart, labs and discussed the procedure including the risks, benefits and alternatives for the proposed anesthesia with the patient or authorized representative who has indicated his/her understanding and acceptance.     Dental Advisory Given  Plan Discussed with: Anesthesiologist, CRNA and Surgeon  Anesthesia Plan Comments: (Patient consented for risks of anesthesia including but not limited to:  -  adverse reactions to medications - damage to eyes, teeth, lips or other oral mucosa - nerve damage due to positioning  - sore throat or hoarseness - Damage to heart, brain, nerves, lungs, other parts of body or loss of life  Patient voiced understanding.)         Anesthesia Quick Evaluation

## 2022-11-02 ENCOUNTER — Other Ambulatory Visit: Payer: Self-pay | Admitting: Family Medicine

## 2022-11-02 DIAGNOSIS — Z1231 Encounter for screening mammogram for malignant neoplasm of breast: Secondary | ICD-10-CM

## 2022-11-02 NOTE — Discharge Instructions (Signed)

## 2022-11-04 ENCOUNTER — Ambulatory Visit: Payer: Medicare HMO | Admitting: Anesthesiology

## 2022-11-04 ENCOUNTER — Encounter
Admission: RE | Disposition: A | Discharge status: Home / Self Care | Payer: Self-pay | Source: Home / Self Care | Attending: Ophthalmology

## 2022-11-04 ENCOUNTER — Encounter: Payer: Self-pay | Admitting: Ophthalmology

## 2022-11-04 ENCOUNTER — Ambulatory Visit
Admission: RE | Admit: 2022-11-04 | Discharge: 2022-11-04 | Disposition: A | Discharge status: Home / Self Care | Payer: Medicare HMO | Attending: Ophthalmology | Admitting: Ophthalmology

## 2022-11-04 ENCOUNTER — Other Ambulatory Visit: Payer: Self-pay

## 2022-11-04 DIAGNOSIS — H2511 Age-related nuclear cataract, right eye: Secondary | ICD-10-CM | POA: Diagnosis present

## 2022-11-04 DIAGNOSIS — F17210 Nicotine dependence, cigarettes, uncomplicated: Secondary | ICD-10-CM | POA: Insufficient documentation

## 2022-11-04 HISTORY — DX: Presence of external hearing-aid: Z97.4

## 2022-11-04 HISTORY — PX: CATARACT EXTRACTION W/PHACO: SHX586

## 2022-11-04 HISTORY — DX: Gastro-esophageal reflux disease without esophagitis: K21.9

## 2022-11-04 SURGERY — PHACOEMULSIFICATION, CATARACT, WITH IOL INSERTION
Anesthesia: Monitor Anesthesia Care | Site: Eye | Laterality: Right

## 2022-11-04 MED ORDER — FENTANYL CITRATE (PF) 100 MCG/2ML IJ SOLN
INTRAMUSCULAR | Status: DC | PRN
  Administered 2022-11-04: 50 ug via INTRAVENOUS

## 2022-11-04 MED ORDER — ARMC OPHTHALMIC DILATING DROPS
1.0000 | OPHTHALMIC | Status: DC | PRN
Start: 2022-11-04 — End: 2022-11-04
  Administered 2022-11-04 (×3): 1 via OPHTHALMIC

## 2022-11-04 MED ORDER — TETRACAINE HCL 0.5 % OP SOLN
1.0000 [drp] | OPHTHALMIC | Status: DC | PRN
  Administered 2022-11-04 (×3): 1 [drp] via OPHTHALMIC

## 2022-11-04 MED ORDER — SIGHTPATH DOSE#1 BSS IO SOLN
INTRAOCULAR | Status: DC | PRN
  Administered 2022-11-04: 15 mL via INTRAOCULAR

## 2022-11-04 MED ORDER — SIGHTPATH DOSE#1 NA HYALUR & NA CHOND-NA HYALUR IO KIT
PACK | INTRAOCULAR | Status: DC | PRN
  Administered 2022-11-04: 1 via OPHTHALMIC

## 2022-11-04 MED ORDER — CEFUROXIME OPHTHALMIC INJECTION 1 MG/0.1 ML
INJECTION | OPHTHALMIC | Status: DC | PRN
  Administered 2022-11-04: .1 mL via INTRACAMERAL

## 2022-11-04 MED ORDER — LACTATED RINGERS IV SOLN: INTRAVENOUS | Status: DC

## 2022-11-04 MED ORDER — MIDAZOLAM HCL 2 MG/2ML IJ SOLN
INTRAMUSCULAR | Status: DC | PRN
  Administered 2022-11-04: 1 mg via INTRAVENOUS

## 2022-11-04 MED ORDER — BRIMONIDINE TARTRATE-TIMOLOL 0.2-0.5 % OP SOLN
OPHTHALMIC | Status: DC | PRN
  Administered 2022-11-04: 1 [drp] via OPHTHALMIC

## 2022-11-04 MED ORDER — SIGHTPATH DOSE#1 BSS IO SOLN
INTRAOCULAR | Status: DC | PRN
  Administered 2022-11-04: 64 mL via OPHTHALMIC

## 2022-11-04 MED ORDER — SIGHTPATH DOSE#1 BSS IO SOLN
INTRAOCULAR | Status: DC | PRN
  Administered 2022-11-04: 2 mL

## 2022-11-04 SURGICAL SUPPLY — 12 items
CANNULA ANT/CHMB 27G (MISCELLANEOUS) IMPLANT
CANNULA ANT/CHMB 27GA (MISCELLANEOUS) IMPLANT
CATARACT SUITE SIGHTPATH (MISCELLANEOUS) ×1 IMPLANT
FEE CATARACT SUITE SIGHTPATH (MISCELLANEOUS) ×1 IMPLANT
GLOVE SRG 8 PF TXTR STRL LF DI (GLOVE) ×1 IMPLANT
GLOVE SURG ENC TEXT LTX SZ7.5 (GLOVE) ×1 IMPLANT
GLOVE SURG UNDER POLY LF SZ8 (GLOVE) ×1
LENS IOL DIOP 21.0 (Intraocular Lens) ×1 IMPLANT
LENS IOL TECNIS MONO 21.0 (Intraocular Lens) IMPLANT
NDL FILTER BLUNT 18X1 1/2 (NEEDLE) ×1 IMPLANT
NEEDLE FILTER BLUNT 18X1 1/2 (NEEDLE) ×1 IMPLANT
SYR 3ML LL SCALE MARK (SYRINGE) ×1 IMPLANT

## 2022-11-04 NOTE — Transfer of Care (Signed)
Immediate Anesthesia Transfer of Care Note  Patient: Debra Frank  Procedure(s) Performed: CATARACT EXTRACTION PHACO AND INTRAOCULAR LENS PLACEMENT (IOC) RIGHT 3.72 00:28.2 (Right: Eye)  Patient Location: PACU  Anesthesia Type: MAC  Level of Consciousness: awake, alert  and patient cooperative  Airway and Oxygen Therapy: Patient Spontanous Breathing and Patient connected to supplemental oxygen  Post-op Assessment: Post-op Vital signs reviewed, Patient's Cardiovascular Status Stable, Respiratory Function Stable, Patent Airway and No signs of Nausea or vomiting  Post-op Vital Signs: Reviewed and stable  Complications: No notable events documented.

## 2022-11-04 NOTE — Anesthesia Postprocedure Evaluation (Signed)
Anesthesia Post Note  Patient: Debra Frank  Procedure(s) Performed: CATARACT EXTRACTION PHACO AND INTRAOCULAR LENS PLACEMENT (IOC) RIGHT 3.72 00:28.2 (Right: Eye)  Patient location during evaluation: PACU Anesthesia Type: MAC Level of consciousness: awake and alert Pain management: pain level controlled Vital Signs Assessment: post-procedure vital signs reviewed and stable Respiratory status: spontaneous breathing, nonlabored ventilation, respiratory function stable and patient connected to nasal cannula oxygen Cardiovascular status: stable and blood pressure returned to baseline Postop Assessment: no apparent nausea or vomiting Anesthetic complications: no   No notable events documented.   Last Vitals:  Vitals:   11/04/22 1147 11/04/22 1153  BP: 138/82 131/78  Pulse: 66 69  Resp: 18 12  Temp: 36.7 C 36.7 C  SpO2: 100% 98%    Last Pain:  Vitals:   11/04/22 1153  TempSrc:   PainSc: 0-No pain                 Larwence Tu C Willard Madrigal

## 2022-11-04 NOTE — H&P (Signed)
[facility]   Primary Care Physician:  Rayetta Humphrey, MD Ophthalmologist: Dr. Lockie Mola  Pre-Procedure History & Physical: HPI:  Debra Frank is a 72 y.o. female here for ophthalmic surgery.   Past Medical History:  Diagnosis Date   Cancer [facility])    uterine cancer   GERD (gastroesophageal reflux disease)    rare   Wears hearing aid in both ears     Past Surgical History:  Procedure Laterality Date   ABDOMINAL HYSTERECTOMY     BREAST CYST ASPIRATION Right 2009   neg   COLONOSCOPY      Prior to Admission medications   Medication Sig Start Date End Date Taking? Authorizing Provider  CALCIUM-VITAMIN D PO Take by mouth daily.   Yes [provider]  clobetasol ointment (TEMOVATE) 0.05 % Apply 1 Application topically 2 (two) times daily. To fingernail   Yes [provider]  gentamicin (GARAMYCIN) 0.3 % ophthalmic ointment 1 Application 2 (two) times daily. To fingernail   Yes [provider]  Multiple Vitamin (MULTIVITAMIN) tablet Take 1 tablet by mouth daily.   Yes [provider]  Multiple Vitamins-Minerals (PRESERVISION AREDS PO) Take by mouth in the morning and at bedtime.   Yes [provider]  tretinoin (RETIN-A) 0.025 % cream Apply topically at bedtime.   Yes [provider]    Allergies as of 10/20/2022   (No Known Allergies)    Family History  Problem Relation Age of Onset   Dementia Mother    Cancer Father    Breast cancer Paternal Aunt 37   Breast cancer Sister 96   Breast cancer Cousin     Social History   Socioeconomic History   Marital status: Married    Spouse name: Not on file   Number of children: Not on file   Years of education: Not on file   Highest education level: Not on file  Occupational History   Not on file  Tobacco Use   Smoking status: Every Day    Packs/day: .15    Types: Cigarettes   Smokeless tobacco: Never   Tobacco comments:    (10/29/22)  3 cigs per  day.  Off and on smoker since approx age 81  Vaping Use   Vaping Use: Never used  Substance and Sexual Activity   Alcohol use: Yes    Comment: Occasional   Drug use: Not on file   Sexual activity: Not on file  Other Topics Concern   Not on file  Social History Narrative   Not on file   Social Determinants of Health   Financial Resource Strain: Not on file  Food Insecurity: Not on file  Transportation Needs: Not on file  Physical Activity: Not on file  Stress: Not on file  Social Connections: Not on file  Intimate Partner Violence: Not on file    Review of Systems: See HPI, otherwise negative ROS  Physical Exam: BP 126/86   Pulse 68   Temp (!) 97.5 F (36.4 C) (Tympanic)   Resp 18   Ht 5\' 5"  (1.651 m)   Wt 56.7 kg   SpO2 99%   BMI 20.80 kg/m  General:   Alert,  pleasant and cooperative in NAD Head:  Normocephalic and atraumatic. Lungs:  Clear to auscultation.    Heart:  Regular rate and rhythm.   Impression/Plan: Debra Frank is here for ophthalmic surgery.  Risks, benefits, limitations, and alternatives regarding ophthalmic surgery have been reviewed with the  patient.  Questions have been answered.  All parties agreeable.   Lockie Mola, MD  11/04/2022, 10:38 AM

## 2022-11-04 NOTE — Op Note (Signed)
LOCATION:  [facility]   PREOPERATIVE DIAGNOSIS:    Nuclear sclerotic cataract right eye. H25.11   POSTOPERATIVE DIAGNOSIS:  Nuclear sclerotic cataract right eye.     PROCEDURE:  Phacoemusification with posterior chamber intraocular lens placement of the right eye   ULTRASOUND TIME: Procedure(s): CATARACT EXTRACTION PHACO AND INTRAOCULAR LENS PLACEMENT (IOC) RIGHT 3.72 00:28.2 (Right)  LENS:   Implant Name Type Inv. Item Serial No. Manufacturer Lot No. LRB No. Used Action  LENS IOL DIOP 21.0 - [id number] Intraocular Lens LENS IOL DIOP 21.0 [id number] [employer]  Right 1 Implanted    DCB00 21.0     SURGEON:  Deirdre Evener, MD   ANESTHESIA:  Topical with tetracaine drops and 2% Xylocaine jelly, augmented with 1% preservative-free intracameral lidocaine.    COMPLICATIONS:  None.   DESCRIPTION OF PROCEDURE:  The patient was identified in the holding room and transported to the operating room and placed in the supine position under the operating microscope.  The right eye was identified as the operative eye and it was prepped and draped in the usual sterile ophthalmic fashion.   A 1 millimeter clear-corneal paracentesis was made at the 12:00 position.  0.5 ml of preservative-free 1% lidocaine was injected into the anterior chamber. The anterior chamber was filled with Viscoat viscoelastic.  A 2.4 millimeter keratome was used to make a near-clear corneal incision at the 9:00 position.  A curvilinear capsulorrhexis was made with a cystotome and capsulorrhexis forceps.  Balanced salt solution was used to hydrodissect and hydrodelineate the nucleus.   Phacoemulsification was then used in stop and chop fashion to remove the lens nucleus and epinucleus.  The remaining cortex was then removed using the irrigation and aspiration handpiece. Provisc was then placed into the capsular bag to distend it for lens placement.  A lens was then injected into the capsular bag.  The remaining  viscoelastic was aspirated.   Wounds were hydrated with balanced salt solution.  The anterior chamber was inflated to a physiologic pressure with balanced salt solution.  No wound leaks were noted. Cefuroxime 0.1 ml of a 10mg /ml solution was injected into the anterior chamber for a dose of 1 mg of intracameral antibiotic at the completion of the case.   Timolol and Brimonidine drops were applied to the eye.  The patient was taken to the recovery room in stable condition without complications of anesthesia or surgery.   Debra Frank 11/04/2022, 11:46 AM

## 2022-11-05 ENCOUNTER — Encounter: Payer: Self-pay | Admitting: Ophthalmology

## 2022-11-12 ENCOUNTER — Ambulatory Visit
Admission: RE | Admit: 2022-11-12 | Discharge: 2022-11-12 | Disposition: A | Discharge status: Home / Self Care | Payer: Medicare HMO | Source: Ambulatory Visit | Attending: Family Medicine | Admitting: Family Medicine

## 2022-11-12 DIAGNOSIS — Z780 Asymptomatic menopausal state: Secondary | ICD-10-CM

## 2022-12-17 ENCOUNTER — Ambulatory Visit
Admission: RE | Admit: 2022-12-17 | Discharge: 2022-12-17 | Disposition: A | Discharge status: Home / Self Care | Payer: Medicare HMO | Source: Ambulatory Visit | Attending: Family Medicine | Admitting: Family Medicine

## 2022-12-17 DIAGNOSIS — Z1231 Encounter for screening mammogram for malignant neoplasm of breast: Secondary | ICD-10-CM | POA: Diagnosis present

## 2023-11-17 ENCOUNTER — Other Ambulatory Visit: Payer: Self-pay | Admitting: Family Medicine

## 2023-11-17 DIAGNOSIS — Z1231 Encounter for screening mammogram for malignant neoplasm of breast: Secondary | ICD-10-CM

## 2024-02-07 ENCOUNTER — Ambulatory Visit
Admission: EM | Admit: 2024-02-07 | Discharge: 2024-02-07 | Disposition: A | Discharge status: Home / Self Care | Attending: Physician Assistant | Admitting: Physician Assistant

## 2024-02-07 DIAGNOSIS — T161XXA Foreign body in right ear, initial encounter: Secondary | ICD-10-CM | POA: Diagnosis not present

## 2024-02-07 NOTE — ED Provider Notes (Signed)
 [facility]    CSN: [id number] Arrival date & time: 02/07/24  0835      History   Chief Complaint Chief Complaint  Patient presents with   Foreign Body in Ear    HPI Debra Frank is a 73 y.o. female presenting for foreign body in the right ear canal.  Patient reports part of her hearing aid came off when she was trying to remove her hearing aids.  She says she has had these hearing aids for a long time.  Unsure if they might need to be replaced.  She denies any associated pain or bleeding, but hearing is worse today.  She was afraid to try remove the foreign body herself.  HPI  Past Medical History:  Diagnosis Date   Cancer [facility])    uterine cancer   GERD (gastroesophageal reflux disease)    rare   Wears hearing aid in both ears     There are no active problems to display for this patient.   Past Surgical History:  Procedure Laterality Date   ABDOMINAL HYSTERECTOMY     BREAST CYST ASPIRATION Right 2009   neg   CATARACT EXTRACTION W/PHACO Right 11/04/2022   Procedure: CATARACT EXTRACTION PHACO AND INTRAOCULAR LENS PLACEMENT (IOC) RIGHT 3.72 00:28.2;  Surgeon: Mittie Gaskin, MD;  Location: [facility];  Service: Ophthalmology;  Laterality: Right;   COLONOSCOPY      OB History   No obstetric history on file.      Home Medications    Prior to Admission medications   Medication Sig Start Date End Date Taking? Authorizing Provider  CALCIUM-VITAMIN D PO Take by mouth daily.    [provider]  clobetasol ointment (TEMOVATE) 0.05 % Apply 1 Application topically 2 (two) times daily. To fingernail    [provider]  gentamicin (GARAMYCIN) 0.3 % ophthalmic ointment 1 Application 2 (two) times daily. To fingernail    [provider]  Multiple Vitamin (MULTIVITAMIN) tablet Take 1 tablet by mouth daily.    [provider]  Multiple Vitamins-Minerals (PRESERVISION AREDS PO) Take by mouth in the morning and at  bedtime.    [provider]  tretinoin (RETIN-A) 0.025 % cream Apply topically at bedtime.    [provider]    Family History Family History  Problem Relation Age of Onset   Dementia Mother    Cancer Father    Breast cancer Paternal Aunt 43   Breast cancer Sister 15   Breast cancer Cousin     Social History Social History   Tobacco Use   Smoking status: Every Day    Current packs/day: 0.15    Types: Cigarettes   Smokeless tobacco: Never   Tobacco comments:    (10/29/22)  3 cigs per day.  Off and on smoker since approx age 47  Vaping Use   Vaping status: Never Used  Substance Use Topics   Alcohol use: Yes    Comment: Occasional     Allergies   Patient has no known allergies.   Review of Systems Review of Systems  HENT:  Negative for ear discharge and ear pain.        Foreign body of right ear canal  Neurological:  Negative for dizziness and headaches.     Physical Exam Triage Vital Signs ED Triage Vitals  Encounter Vitals Group     BP 02/07/24 0902 131/86     Girls Systolic BP Percentile --      Girls Diastolic  BP Percentile --      Boys Systolic BP Percentile --      Boys Diastolic BP Percentile --      Pulse Rate 02/07/24 0902 79     Resp 02/07/24 0902 16     Temp 02/07/24 0902 98.1 F (36.7 C)     Temp Source 02/07/24 0902 Oral     SpO2 02/07/24 0902 97 %     Weight --      Height --      Head Circumference --      Peak Flow --      Pain Score 02/07/24 0901 0     Pain Loc --      Pain Education --      Exclude from Growth Chart --    No data found.  Updated Vital Signs BP 131/86 (BP Location: Right Arm)   Pulse 79   Temp 98.1 F (36.7 C) (Oral)   Resp 16   SpO2 97%       Physical Exam Vitals and nursing note reviewed.  Constitutional:      General: She is not in acute distress.    Appearance: Normal appearance. She is not ill-appearing or toxic-appearing.  HENT:     Head: Normocephalic and atraumatic.      Right Ear: A foreign body (gray tip of hearing aid in right EAC) is present.  Eyes:     General: No scleral icterus.       Right eye: No discharge.        Left eye: No discharge.     Conjunctiva/sclera: Conjunctivae normal.  Cardiovascular:     Rate and Rhythm: Normal rate and regular rhythm.  Pulmonary:     Effort: Pulmonary effort is normal. No respiratory distress.  Musculoskeletal:     Cervical back: Neck supple.  Skin:    General: Skin is dry.  Neurological:     General: No focal deficit present.     Mental Status: She is alert. Mental status is at baseline.     Motor: No weakness.     Gait: Gait normal.  Psychiatric:        Mood and Affect: Mood normal.        Behavior: Behavior normal.      UC Treatments / Results  Labs (all labs ordered are listed, but only abnormal results are displayed) Labs Reviewed - No data to display  EKG   Radiology No results found.  Procedures Foreign Body Removal  Date/Time: 02/07/2024 9:10 AM  Performed by: Arvis Jolan NOVAK, PA-C Authorized by: Arvis Jolan NOVAK, PA-C   Consent:    Consent obtained:  Verbal   Consent given by:  Patient   Risks, benefits, and alternatives were discussed: yes     Risks discussed:  Bleeding, infection, pain, worsening of condition and incomplete removal Universal protocol:    Patient identity confirmed:  Verbally with patient Location:    Location:  Ear   Ear location:  R ear Anesthesia:    Anesthesia method:  None Procedure details:    Foreign bodies recovered:  1   Description:  Gray tip of hearing aid   Intact foreign body removal: yes   Post-procedure details:    Confirmation:  No additional foreign bodies on visualization   Procedure completion:  Tolerated well, no immediate complications  (including critical care time)  Medications Ordered in UC Medications - No data to display  Initial Impression / Assessment and Plan / UC Course  I have reviewed the triage vital signs and the  nursing notes.  Pertinent labs & imaging results that were available during my care of the patient were reviewed by me and considered in my medical decision making (see chart for details).   73 year old female presents for foreign body in the right ear canal.  A piece of her hearing aid is stuck in her right ear canal as of this morning.  No associated pain or bleeding.  Hearing muffled.  Patient gave consent for removal of foreign body.  Used alligator forceps to remove foreign body which was fully intact.  Patient tolerated well.  No additional foreign bodies noted.  Reviewed return precautions.   Final Clinical Impressions(s) / UC Diagnoses   Final diagnoses:  Acute foreign body of right ear canal, initial encounter   Discharge Instructions   None    ED Prescriptions   None    PDMP not reviewed this encounter.   Arvis Jolan NOVAK, PA-C 02/07/24 [phone]

## 2024-02-07 NOTE — ED Triage Notes (Signed)
 Pt present the ball that at the end of her hearing stuck inside right ear.

## 2024-02-08 ENCOUNTER — Ambulatory Visit
Admission: RE | Admit: 2024-02-08 | Discharge: 2024-02-08 | Disposition: A | Discharge status: Home / Self Care | Source: Ambulatory Visit | Attending: Family Medicine | Admitting: Family Medicine

## 2024-02-08 DIAGNOSIS — Z1231 Encounter for screening mammogram for malignant neoplasm of breast: Secondary | ICD-10-CM | POA: Diagnosis present

## 2024-05-18 IMAGING — MG MM DIGITAL SCREENING BILAT W/ TOMO AND CAD
8 series · 9 of 24 positions shown · non-contrast
Comparison: Previous exam(s).

CLINICAL DATA: Screening.

EXAM:
DIGITAL SCREENING BILATERAL MAMMOGRAM WITH TOMOSYNTHESIS AND CAD
TECHNIQUE: Bilateral screening digital craniocaudal and mediolateral oblique
mammograms were obtained. Bilateral screening digital breast
tomosynthesis was performed. The images were evaluated with
computer-aided detection.

[L MLO synth-2D]
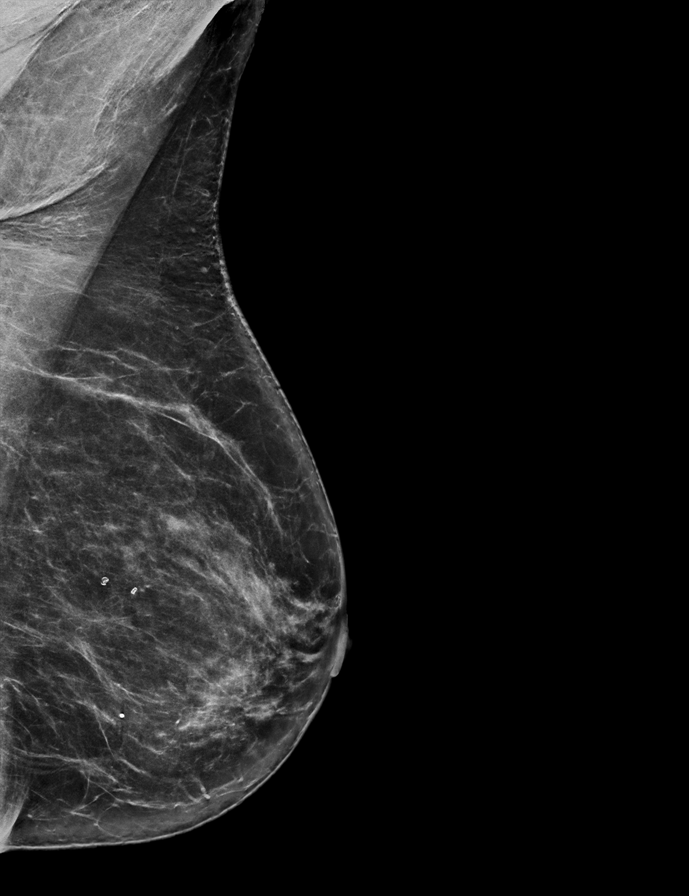

[R CC synth-2D]
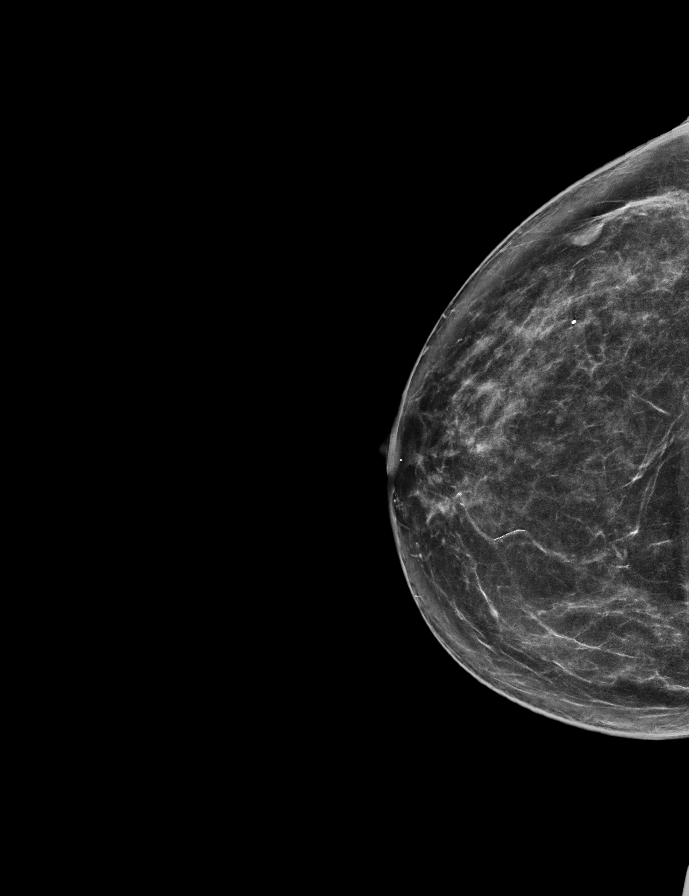

[L CC synth-2D]
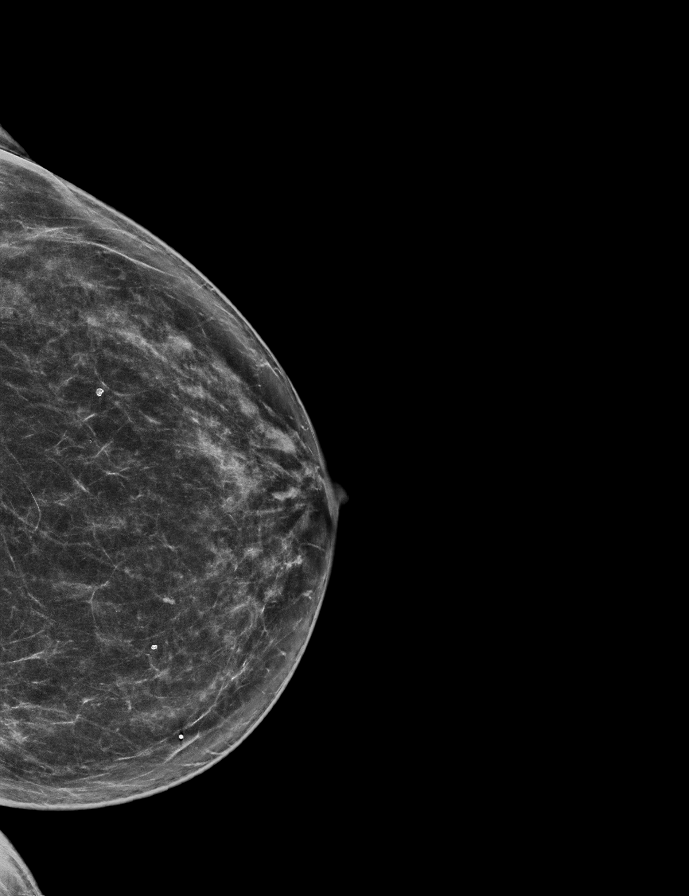

[R MLO synth-2D]
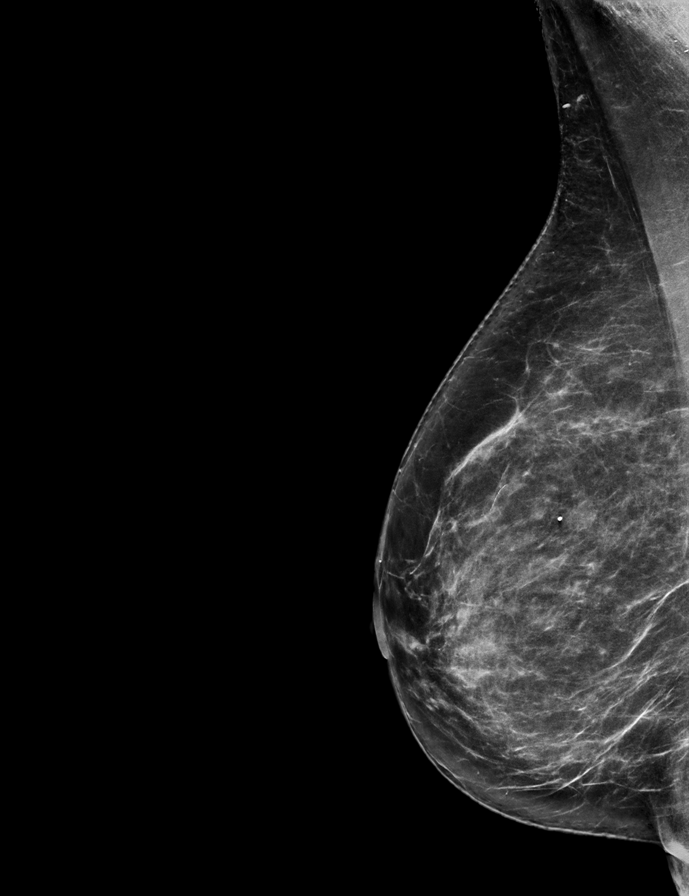

[R CC tomo · 2 of 71 frames shown]
[frame 23/71]
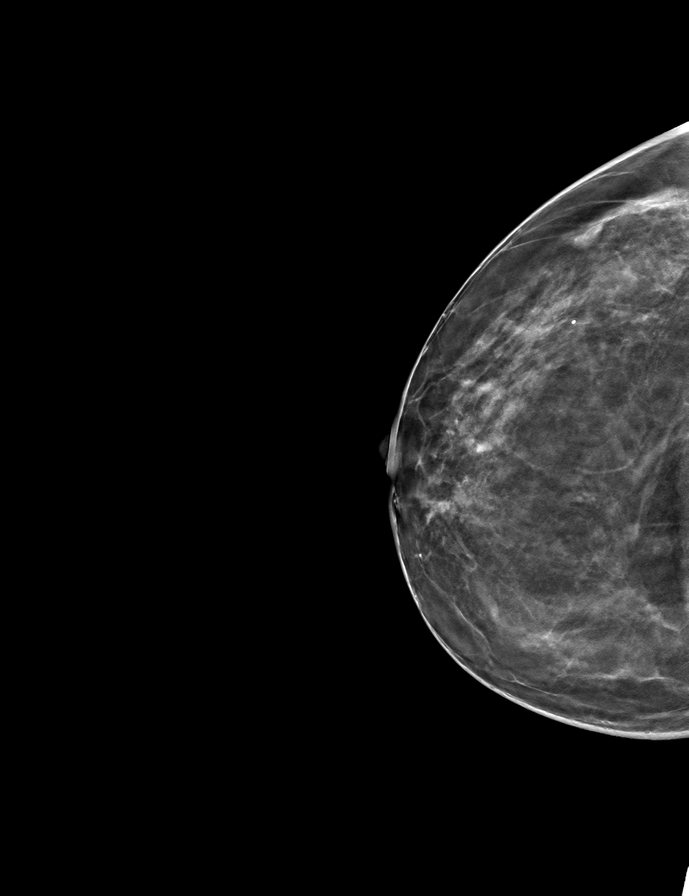
[frame 36/71]
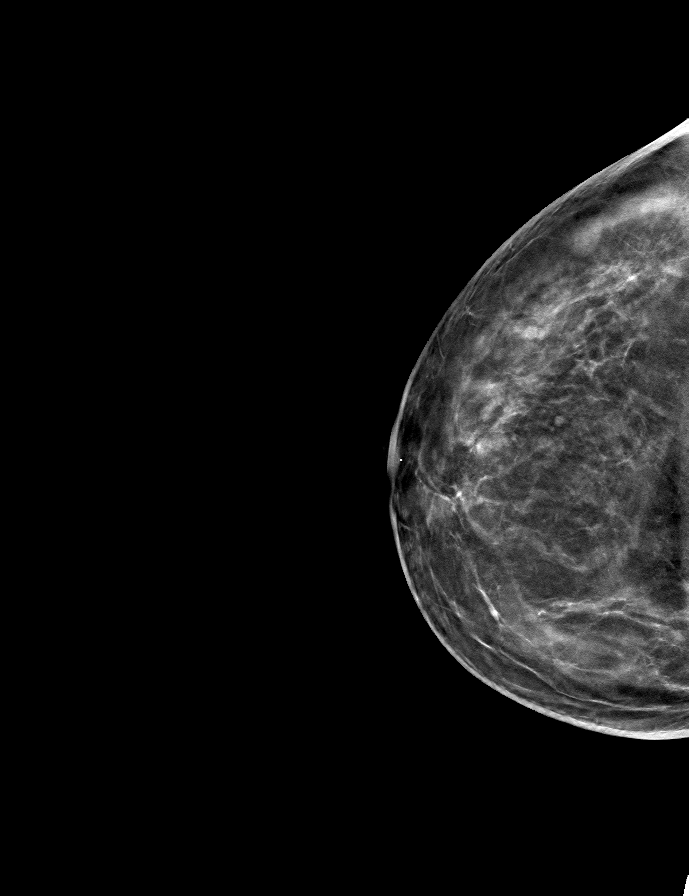

[L MLO tomo · tomo slice 39/76.0]
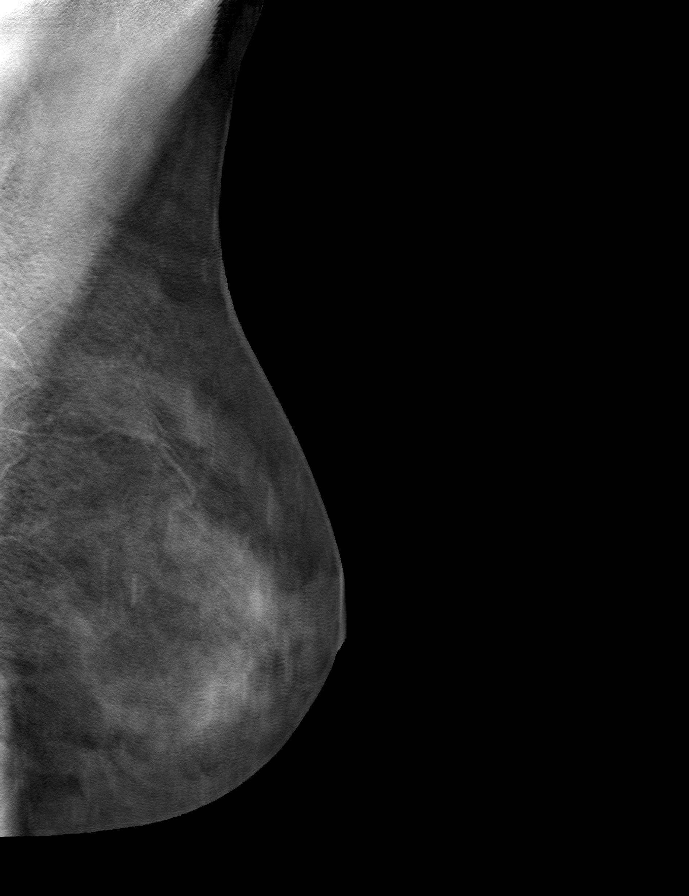

[R MLO tomo · tomo slice 37/73.0]
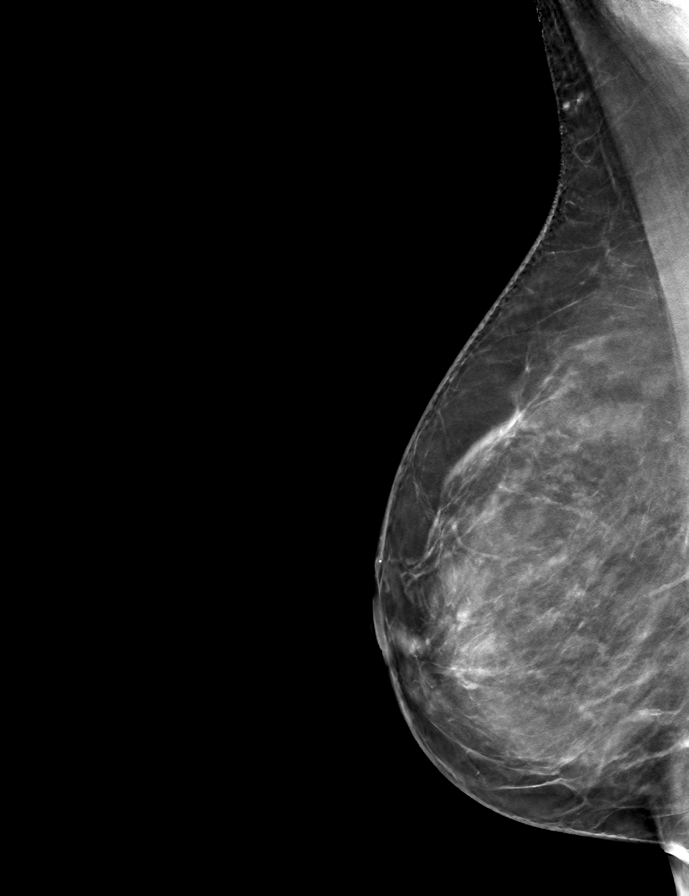

[L CC tomo · tomo slice 36/71.0]
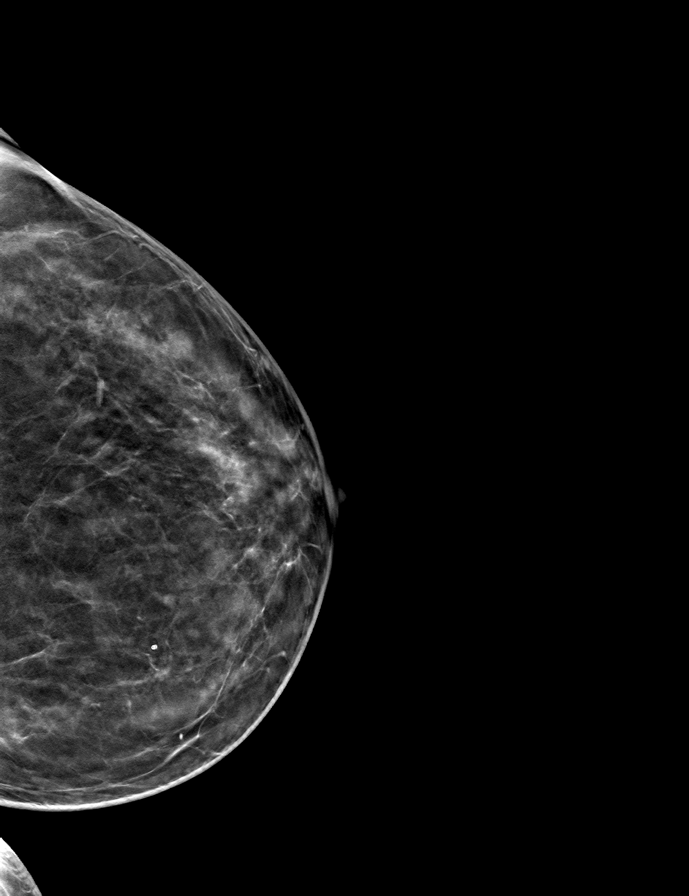

[9 of 24 positions shown; findings below may reference images not displayed]

ACR Breast Density Category b: There are scattered areas of
fibroglandular density.
FINDINGS: In the right breast, a possible mass warrants further evaluation. In
the left breast, no findings suspicious for malignancy.
IMPRESSION: Further evaluation is suggested for a possible mass in the right
breast.

RECOMMENDATION:
Diagnostic mammogram and possibly ultrasound of the right breast.
(Code:[id number]-E-WW6)

The patient will be contacted regarding the findings, and additional
imaging will be scheduled.

BI-RADS CATEGORY  0: Incomplete. Need additional imaging evaluation
and/or prior mammograms for comparison.
